# Patient Record
Sex: Male | Born: 1985 | Race: White | Hispanic: No | Marital: Single | Smoking: Current every day smoker
Health system: Southern US, Community
[De-identification: ages and names within clinical notes are randomized; demographics above are authoritative.]

## PROBLEM LIST (undated history)

## (undated) DIAGNOSIS — E669 Obesity, unspecified: Secondary | ICD-10-CM

## (undated) DIAGNOSIS — I10 Essential (primary) hypertension: Secondary | ICD-10-CM

## (undated) DIAGNOSIS — S6990XA Unspecified injury of unspecified wrist, hand and finger(s), initial encounter: Secondary | ICD-10-CM

## (undated) HISTORY — PX: FRACTURE SURGERY: SHX138

---
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
## 2005-08-10 ENCOUNTER — Emergency Department (HOSPITAL_COMMUNITY): Admission: EM | Admit: 2005-08-10 | Discharge: 2005-08-10 | Payer: Self-pay | Admitting: *Deleted

## 2006-05-08 DIAGNOSIS — E669 Obesity, unspecified: Secondary | ICD-10-CM

## 2006-05-08 DIAGNOSIS — J45909 Unspecified asthma, uncomplicated: Secondary | ICD-10-CM | POA: Insufficient documentation

## 2006-07-16 ENCOUNTER — Emergency Department (HOSPITAL_COMMUNITY): Admission: EM | Admit: 2006-07-16 | Discharge: 2006-07-17 | Payer: Self-pay | Admitting: Emergency Medicine

## 2007-06-12 ENCOUNTER — Emergency Department (HOSPITAL_COMMUNITY): Admission: EM | Admit: 2007-06-12 | Discharge: 2007-06-12 | Payer: Self-pay | Admitting: Emergency Medicine

## 2007-07-01 ENCOUNTER — Emergency Department (HOSPITAL_COMMUNITY): Admission: EM | Admit: 2007-07-01 | Discharge: 2007-07-01 | Payer: Self-pay | Admitting: Emergency Medicine

## 2008-02-18 ENCOUNTER — Emergency Department (HOSPITAL_COMMUNITY): Admission: EM | Admit: 2008-02-18 | Discharge: 2008-02-18 | Payer: Self-pay | Admitting: Emergency Medicine

## 2008-03-22 ENCOUNTER — Emergency Department (HOSPITAL_COMMUNITY): Admission: EM | Admit: 2008-03-22 | Discharge: 2008-03-22 | Payer: Self-pay | Admitting: Emergency Medicine

## 2008-05-03 ENCOUNTER — Emergency Department (HOSPITAL_COMMUNITY): Admission: EM | Admit: 2008-05-03 | Discharge: 2008-05-03 | Payer: Self-pay | Admitting: Family Medicine

## 2009-05-08 ENCOUNTER — Emergency Department (HOSPITAL_COMMUNITY): Admission: EM | Admit: 2009-05-08 | Discharge: 2009-05-08 | Payer: Self-pay | Admitting: Emergency Medicine

## 2009-05-10 ENCOUNTER — Emergency Department (HOSPITAL_COMMUNITY): Admission: EM | Admit: 2009-05-10 | Discharge: 2009-05-11 | Payer: Self-pay | Admitting: Emergency Medicine

## 2010-06-09 ENCOUNTER — Emergency Department (HOSPITAL_COMMUNITY)
Admission: EM | Admit: 2010-06-09 | Discharge: 2010-06-09 | Disposition: A | Discharge status: Home / Self Care | Payer: Self-pay | Attending: Emergency Medicine | Admitting: Emergency Medicine

## 2010-06-09 DIAGNOSIS — L989 Disorder of the skin and subcutaneous tissue, unspecified: Secondary | ICD-10-CM | POA: Insufficient documentation

## 2010-06-09 DIAGNOSIS — L255 Unspecified contact dermatitis due to plants, except food: Secondary | ICD-10-CM | POA: Insufficient documentation

## 2010-07-27 NOTE — Op Note (Signed)
[place]. [facility]  Patient:    Shaun Sanchez, Shaun Sanchez                       MRN: [id number] Proc. Date: 08/12/00 Adm. Date:  [id number] Attending:  Leonia Corona                           Operative Report  PREOPERATIVE DIAGNOSIS:  Acute appendicitis.  POSTOPERATIVE DIAGNOSIS:  Acute appendicitis.  PROCEDURE:  Open appendectomy.  ANESTHESIA:  General endotracheal tube anesthesia.  SURGEON:  Nelida Meuse, M.D.  ASSISTANT:  Nurse.  DESCRIPTION OF PROCEDURE:  The patient is brought into operating room, placed supine on operating table.  General endotracheal tube anesthesia is given. The right lower quadrant of the abdomen and the surrounding abdominal wall is cleaned, prepped, and draped in the usual manner.  A McBurney incision was made centered at the McBurney point, running slightly curvilinear along the skin crease in the right lower quadrant, measuring about 5-6 cm.  The skin incision is deepened through the subcutaneous tissue using electrocautery. The two layers of subcutaneous fatty fascial layers were divided with electrocautery until the aponeurosis of the external oblique is exposed, which was nicked with a knife and opened along the line of its fibers, running downward and medially and extending laterally, splitting the muscle fibers. After dividing the external aponeurosis, the fibers of the internal oblique and transversus abdominis muscles were split with the help of a blunt-tip hemostat, pierced with force, and fibers separated, immediately inserting the blade of [employer] retractor and stretching it to spread the fibers.  In view of his severe obesity, a very large layer of fat, subcutaneous fat, was encountered, and the limited exposure within here by spreading the muscles had a very limited access to the peritoneum, which was exposed.  The peritoneum was picked up with two hemostats and divided in between, and serous fluid  was immediately gushed out upon the incision, which was suctioned out completely. The peritoneal opening was enlarged with the help of scissors along the line of incision, and the blade of the retractor was inserted into the peritoneal cavity to visualize at this point that this is a very limited access to the peritoneal cavity.  We widened the muscle fibers medially and laterally for about 0.5 cm on each side to enhance the exposure.  I was able to palpate the tense, turgid, swollen appendix lying in the middle of the abdomen; however, the cecum was badly peritonealized and did not allow the delivery of the appendix out of the wound.  We had considerable difficulty in mobilizing the cecum and the appendix, so by blunt finger dissection and dividing the swollen mesentery containing the appendiceal vessels, which were divided between clamps and divided using 2-0 silk.  After gradually ligating the branches of the appendiceal artery with multiple stitches, we were able to clear up the appendix up to the base.  The wall of the cecum appeared healthy and normal. The appendix was held over the Babcock forceps and due to very tense and turgid wall on pulling out out of the wound, it tore and ruptured outside the peritoneal cavity; however, no spillage took place inside the peritoneal cavity.  The cecum was held over the left hand, and a pursestring suture was made using 4-0 silk around the base of the appendix.  The base of the appendix was crushed with a straight clamp, and  the clamp was applied above it.  The appendix was ligated with 3-0 Vicryl.  A double ligation was done, and the appendix was divided and delivered out of the wound.  The mucosa of the stump of the appendix was cauterized, and the stump was buried by tightening the silk pursestring suture.  The stump was neatly buried inside the pursestring suture.  The cecum was released and allowed to fall back into the peritoneal cavity.  A  very generous local washout with normal saline was done.  No oozing or bleeding was noted in the peritoneal cavity.  No attempt was made to run the bowel to look for any associated anomalies or Meckels.  Once again the peritoneal cavity was irrigated in a limited fashion, and complete suction was done from the pelvic area as well as the paracolic area.  The abdomen was now closed in multiple layers.  The peritoneum was closed with 2-0 Vicryl running stitch.  The divided muscles were repaired using interrupted stitches with 2-0 Vicryl.  The internal oblique and transversus abdominis muscles were approximated with two interrupted sutures using 2-0 Vicryl, and the external aponeurosis was repaired with 2-0 Vicryl interrupted sutures.  The wound was now irrigated with copious amount of saline, and approximately 20 cc of 0.25% Marcaine with epinephrine was infiltrated in and around the incision in the subcutaneous plane for postoperative pain control.  The skin was closed in two layers, the deeper subcutaneous layer using interrupted sutures with 2-0 Vicryl and the skin with 4-0 nylon interrupted sutures.  Steri-Strips were applied in _____ with the stitches, which was further covered with sterile gauze and OpSite dressing.  Patient tolerated the procedure very well, which was smooth and uneventful.  The patient was later extubated and transported to recovery room in good and stable condition. DD:  08/12/00 TD:  08/13/00 Job: [id number]

## 2010-07-27 NOTE — Op Note (Signed)
[place]. [facility]  Patient:    CREEK, GAN                         MRN: [id number] Proc. Date: 08/29/99 Attending:  Loraine Leriche C. Ophelia Charter, M.D.                           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  PROCEDURE:  Removal of single Ace cannulated screw, right hip.  SURGEON:  Mark C. Ophelia Charter, M.D.  ASSISTANT:  Quinn Plowman, P.A.-C.  ANESTHESIA:  General.  ESTIMATED BLOOD LOSS:  Less than 50 cc.  DESCRIPTION OF PROCEDURE:  After induction of general anesthesia, the patient was placed in the lateral position with an axillary roll underneath the left axilla and the right hip up.  The hip was prepped with Duraprep after a 10-15 drape was applied, split sheets, impervious stockinette, Coban was applied. Under fluoroscopy, appropriate position was outlined for a 3 cm incision.  The tensor fascia was split in line with its fibers.  Dissection with fingertip was used to feel the palpable pin.  A split was made in the vastus lateralis, and a Steinmann pin was inserted up the cannulated screw.  Cannulated screwdriver was then inserted over the Steinman pin and turned until the hexagonal screwdriver caught the head, and the pin was extracted.  After irrigation with saline solution, the patient had received some Ancef at the beginning of the procedure, and the tensor fascia was closed with 0 Vicryl interrupted sutures, 2-0 Vicryl in the subcutaneous tissue, 4-0 Vicryl subcuticular skin closure, tincture of benzoin and Steri-Strips and a small dressing.  The patient was transferred to the recovery room in stable condition.  Instrument count and needle count was correct. DD:  08/29/99 TD:  08/31/99 Job: [id number]

## 2010-07-27 NOTE — Discharge Summary (Signed)
[place]. [facility]  Patient:    Shaun Sanchez, Shaun Sanchez                       MRN: [id number] Adm. Date:  [id number] Disc. Date: [id number] Attending:  Leonia Corona Dictator:   Dalbert Mayotte, M.D.                           Discharge Summary  DATE OF BIRTH:  1985-10-14  DISCHARGE DIAGNOSIS:  Acute appendicitis.  DISCHARGE MEDICATIONS:  Tylenol p.r.n.  PROCEDURES:  Open appendectomy under general endotracheal anesthesia.  HISTORY OF PRESENT ILLNESS:  The patient is a 25 year old white male who presented with the onset of acute abdominal pain which began on Saturday in the periumbilical area and had continued to get worse since then, without any relief.  The pain was localized worse in the right lower quadrant, with some increasing intensity.  The patient did have associated nausea, vomiting, fever, and diarrhea.  ADMISSION LABORATORY DATA:  White count 14.9, 82% segs.  CT of the abdomen with rectal contrast reported to be consistent with acute appendicitis.  The patient was taken for open appendectomy.  HOSPITAL COURSE: #1 - After the findings of CT scan, the patient was taken for open appendectomy which was performed on the morning of August 12, 2000, by Dr. Leeanne Mannan.  The patient did have an intraoperative rupture outside of the peritoneal cavity of his appendix, but otherwise had an uncomplicated operation.  He did receive perioperative antibiotics for 24 hours and was kept n.p.o.  On the day after his surgery he was encouraged to ambulate about and was advanced to clears with sips of water and tolerated this well.  He was advanced to full clears.  On the morning of discharge, August 14, 2000, he was tolerating a liquid diet well.  He was ambulating well.  He had had a bowel movement and was passing flatus.  For this reason, he was advanced to a soft diet, tolerated this well, and was discharged home.  He remained afebrile over his hospitalization,  with the exception of the day prior to discharge with a t-max of 101.1.  #2 - BORDERLINE HIGH BLOOD PRESSURES:  The patient on admission had a blood pressure of 153/79.  This was on the Dinamap blood pressure cuff, and it was felt that the patient could have these elevated pressures secondary to pain, although he was rating his pain anywhere from 2-6 at that time.  This was followed, and it remained in the 140-150 range systolic/70-80 range diastolic but, on the day of discharge, a manual cuff was used, and the pressures were found to be in the 130s/60s and 70s.  This was felt to be most likely secondary to the Dinamap malfunction.  This is something that will need to be followed up on an outpatient basis to insure that there is no underlying hypertension, although he gave no family history of this.  DISPOSITION:  The patient was discharged home with his family.  DISCHARGE INSTRUCTIONS:  He was told to return if he has any concerns, or call Dr. Leeanne Mannan, or come back if he should note his incision becoming red, painful, or having any purulent discharge.  FOLLOW-UP:  He is scheduled to follow up with Dr. Leeanne Mannan on Friday, August 22, 2000, at 3 p.m. and with myself, Dr. Drue Second, on September 17, 2000, at 9:50 a.m. at the family practice center.  He was given the number, [phone], to call for directions or any other questions. DD:  08/14/00 TD:  08/16/00 Job: [id number]

## 2010-12-04 LAB — URINALYSIS, ROUTINE W REFLEX MICROSCOPIC
Bilirubin Urine: NEGATIVE
Glucose, UA: NEGATIVE
Hgb urine dipstick: NEGATIVE
Ketones, ur: NEGATIVE
Nitrite: NEGATIVE
Protein, ur: NEGATIVE
Specific Gravity, Urine: 1.022
Urobilinogen, UA: 1
pH: 8

## 2010-12-04 LAB — DIFFERENTIAL
Basophils Absolute: 0
Basophils Relative: 0
Eosinophils Absolute: 0.1
Eosinophils Relative: 1
Lymphocytes Relative: 22
Lymphs Abs: 1.8
Monocytes Absolute: 1
Monocytes Relative: 13 — ABNORMAL HIGH
Neutro Abs: 5.3
Neutrophils Relative %: 64

## 2010-12-04 LAB — CBC
HCT: 42.5
Hemoglobin: 15
MCHC: 35.2
MCV: 87.5
Platelets: 154
RBC: 4.86
RDW: 13.5
WBC: 8.2

## 2010-12-04 LAB — LIPASE, BLOOD: Lipase: 19

## 2010-12-04 LAB — HEPATIC FUNCTION PANEL
ALT: 20
AST: 28
Albumin: 3.6
Alkaline Phosphatase: 82
Bilirubin, Direct: 0.4 — ABNORMAL HIGH
Indirect Bilirubin: 0.7
Total Bilirubin: 1.1
Total Protein: 6.8

## 2010-12-04 LAB — POCT I-STAT, CHEM 8
BUN: 15
Calcium, Ion: 1.23
Chloride: 102
Creatinine, Ser: 1
Glucose, Bld: 92
HCT: 44
Hemoglobin: 15
Potassium: 4.2
Sodium: 140
TCO2: 27

## 2011-12-02 ENCOUNTER — Emergency Department: Payer: Self-pay | Admitting: Emergency Medicine

## 2012-02-24 ENCOUNTER — Emergency Department: Payer: Self-pay | Admitting: Emergency Medicine

## 2013-07-07 ENCOUNTER — Emergency Department: Payer: Self-pay | Admitting: Emergency Medicine

## 2013-07-07 LAB — URINALYSIS, COMPLETE
Bacteria: NONE SEEN
Bilirubin,UR: NEGATIVE
Blood: NEGATIVE
Glucose,UR: NEGATIVE mg/dL (ref 0–75)
Ketone: NEGATIVE
Leukocyte Esterase: NEGATIVE
Nitrite: NEGATIVE
Ph: 6 (ref 4.5–8.0)
Protein: NEGATIVE
RBC,UR: NONE SEEN /HPF (ref 0–5)
Specific Gravity: 1.058 (ref 1.003–1.030)
Squamous Epithelial: <1
WBC UR: 1 /HPF (ref 0–5)

## 2013-07-07 LAB — COMPREHENSIVE METABOLIC PANEL
Albumin: 4 g/dL (ref 3.4–5.0)
Alkaline Phosphatase: 99 U/L
Anion Gap: 8 (ref 7–16)
BUN: 18 mg/dL (ref 7–18)
Bilirubin,Total: 0.4 mg/dL (ref 0.2–1.0)
Calcium, Total: 8.8 mg/dL (ref 8.5–10.1)
Chloride: 104 mmol/L (ref 98–107)
Co2: 27 mmol/L (ref 21–32)
Creatinine: 0.98 mg/dL (ref 0.60–1.30)
EGFR (African American): >60
EGFR (Non-African Amer.): >60
Glucose: 117 mg/dL — ABNORMAL HIGH (ref 65–99)
Osmolality: 280 (ref 275–301)
Potassium: 3.9 mmol/L (ref 3.5–5.1)
SGOT(AST): 25 U/L (ref 15–37)
SGPT (ALT): 34 U/L (ref 12–78)
Sodium: 139 mmol/L (ref 136–145)
Total Protein: 7.7 g/dL (ref 6.4–8.2)

## 2013-07-07 LAB — CBC
HCT: 49 % (ref 40.0–52.0)
HGB: 16.2 g/dL (ref 13.0–18.0)
MCH: 29 pg (ref 26.0–34.0)
MCHC: 33.1 g/dL (ref 32.0–36.0)
MCV: 88 fL (ref 80–100)
Platelet: 156 10*3/uL (ref 150–440)
RBC: 5.59 10*6/uL (ref 4.40–5.90)
RDW: 14.1 % (ref 11.5–14.5)
WBC: 16.6 10*3/uL — ABNORMAL HIGH (ref 3.8–10.6)

## 2013-07-07 LAB — LIPASE, BLOOD: Lipase: 105 U/L (ref 73–393)

## 2015-11-07 ENCOUNTER — Emergency Department
Admission: EM | Admit: 2015-11-07 | Discharge: 2015-11-07 | Disposition: A | Discharge status: Home / Self Care | Payer: Managed Care, Other (non HMO) | Attending: Emergency Medicine | Admitting: Emergency Medicine

## 2015-11-07 ENCOUNTER — Encounter: Payer: Self-pay | Admitting: Emergency Medicine

## 2015-11-07 DIAGNOSIS — J45909 Unspecified asthma, uncomplicated: Secondary | ICD-10-CM | POA: Insufficient documentation

## 2015-11-07 DIAGNOSIS — R112 Nausea with vomiting, unspecified: Secondary | ICD-10-CM | POA: Insufficient documentation

## 2015-11-07 DIAGNOSIS — R197 Diarrhea, unspecified: Secondary | ICD-10-CM | POA: Insufficient documentation

## 2015-11-07 DIAGNOSIS — F17210 Nicotine dependence, cigarettes, uncomplicated: Secondary | ICD-10-CM | POA: Insufficient documentation

## 2015-11-07 DIAGNOSIS — R110 Nausea: Secondary | ICD-10-CM

## 2015-11-07 LAB — BASIC METABOLIC PANEL
Anion gap: 5 (ref 5–15)
BUN: 19 mg/dL (ref 6–20)
CO2: 27 mmol/L (ref 22–32)
Calcium: 9.3 mg/dL (ref 8.9–10.3)
Chloride: 106 mmol/L (ref 101–111)
Creatinine, Ser: 0.86 mg/dL (ref 0.61–1.24)
GFR calc Af Amer: >60 mL/min (ref >60)
GFR calc non Af Amer: >60 mL/min (ref >60)
Glucose, Bld: 98 mg/dL (ref 65–99)
Potassium: 4.6 mmol/L (ref 3.5–5.1)
Sodium: 138 mmol/L (ref 135–145)

## 2015-11-07 LAB — CBC WITH DIFFERENTIAL/PLATELET
Basophils Absolute: 0.1 10*3/uL (ref 0–0.1)
Basophils Relative: 1 %
Eosinophils Absolute: 0.1 10*3/uL (ref 0–0.7)
Eosinophils Relative: 1 %
HCT: 45.6 % (ref 40.0–52.0)
Hemoglobin: 16.1 g/dL (ref 13.0–18.0)
Lymphocytes Relative: 27 %
Lymphs Abs: 2.7 10*3/uL (ref 1.0–3.6)
MCH: 31.2 pg (ref 26.0–34.0)
MCHC: 35.4 g/dL (ref 32.0–36.0)
MCV: 88 fL (ref 80.0–100.0)
Monocytes Absolute: 0.8 10*3/uL (ref 0.2–1.0)
Monocytes Relative: 8 %
Neutro Abs: 6.2 10*3/uL (ref 1.4–6.5)
Neutrophils Relative %: 63 %
Platelets: 157 10*3/uL (ref 150–440)
RBC: 5.18 MIL/uL (ref 4.40–5.90)
RDW: 13.4 % (ref 11.5–14.5)
WBC: 9.8 10*3/uL (ref 3.8–10.6)

## 2015-11-07 MED ORDER — ONDANSETRON 4 MG PO TBDP
4.0000 mg | ORAL_TABLET | Freq: Once | ORAL | Status: AC
  Administered 2015-11-07: 4 mg via ORAL
  Filled 2015-11-07: qty 1

## 2015-11-07 MED ORDER — ONDANSETRON HCL 4 MG PO TABS: 4.0000 mg | ORAL_TABLET | Freq: Three times a day (TID) | ORAL | 0 refills | Status: AC | PRN

## 2015-11-07 NOTE — ED Provider Notes (Signed)
[facility] Emergency Department Provider Note  ____________________________________________  Time seen: Approximately 2:10 PM  I have reviewed the triage vital signs and the nursing notes.   HISTORY  Chief Complaint Emesis    HPI Shaun Sanchez is a 30 y.o. male , NAD, presents to the emergency department with 2 day history of nausea and vomiting. Patient states he had sudden onset of upset stomach and nausea began yesterday. Episodes are sporadic and not associated with eating or drinking. Has not had any abdominal pain or cramping. Had one episode of diarrhea that has since resolved. Has had normal bowel movements over the last 24 hours. Denies any blood in the emesis or stool. No fevers, chills, body aches, sore throat, rash. No fatigue, chest pain, shortness of breath. No diaphoresis. No known sick contacts.   History reviewed. No pertinent past medical history.  Patient Active Problem List   Diagnosis Date Noted  . OBESITY, NOS 05/08/2006  . ASTHMA, UNSPECIFIED 05/08/2006    History reviewed. No pertinent surgical history.  Prior to Admission medications   Medication Sig Start Date End Date Taking? Authorizing Provider  ondansetron (ZOFRAN) 4 MG tablet Take 1 tablet (4 mg total) by mouth every 8 (eight) hours as needed for nausea or vomiting. 11/07/15 11/14/15  Rahshawn Remo L Xerxes Agrusa, PA-C    Allergies Review of patient's allergies indicates not on file.  History reviewed. No pertinent family history.  Social History Social History  Substance Use Topics  . Smoking status: Current Every Day Smoker    Packs/day: 1.00    Types: Cigarettes  . Smokeless tobacco: Never Used  . Alcohol use No     Review of Systems  Constitutional: No fever/chills, fatigue ENT: No sore throat. Cardiovascular: No chest pain. Respiratory:  No shortness of breath. No wheezing.  Gastrointestinal: Positive nausea, vomiting. One episode diarrhea that has resolved. No  abdominal pain. No constipation. Genitourinary: Negative for dysuria, hematuria. No urinary hesitancy, urgency or increased frequency. Musculoskeletal: Negative for back pain nor general myalgias.  Skin: Negative for rash, skin sores. Neurological: Negative for headaches, focal weakness or numbness. No saddle paresthesias, loss of bowel or bladder control 10-point ROS otherwise negative.  ____________________________________________   PHYSICAL EXAM:  VITAL SIGNS: ED Triage Vitals [11/07/15 1345]  Enc Vitals Group     BP (!) 139/96     Pulse Rate 87     Resp 16     Temp 97.9 F (36.6 C)     Temp Source Oral     SpO2 96 %     Weight 280 lb (127 kg)     Height 6\' 2"  (1.88 m)     Head Circumference      Peak Flow      Pain Score      Pain Loc      Pain Edu?      Excl. in GC?      Constitutional: Alert and oriented. Well appearing and in no acute distress. Eyes: Conjunctivae are normal.  Head: Atraumatic. Cardiovascular: Normal rate, regular rhythm. Normal S1 and S2.  Good peripheral circulation. Respiratory: Normal respiratory effort without tachypnea or retractions. Lungs CTAB with breath sounds noted in all lung fields. Gastrointestinal: Soft and nontender without distention or guarding in all quadrants. Bowel sounds grossly normoactive in all quadrants. No rebound or rigidity. Musculoskeletal: No lower extremity tenderness nor edema.  No joint effusions. Neurologic:  Normal speech and language. No gross focal neurologic deficits are appreciated.  Skin:  Skin is warm, dry and intact. No rash noted. Psychiatric: Mood and affect are normal. Speech and behavior are normal. Patient exhibits appropriate insight and judgement.   ____________________________________________   LABS (all labs ordered are listed, but only abnormal results are displayed)  Labs Reviewed  BASIC METABOLIC PANEL  CBC WITH DIFFERENTIAL/PLATELET    ____________________________________________  EKG  None ____________________________________________  RADIOLOGY  None ____________________________________________    PROCEDURES  Procedure(s) performed: None   Procedures   Medications  ondansetron (ZOFRAN-ODT) disintegrating tablet 4 mg (4 mg Oral Given 11/07/15 1431)     ____________________________________________   INITIAL IMPRESSION / ASSESSMENT AND PLAN / ED COURSE  Pertinent labs & imaging results that were available during my care of the patient were reviewed by me and considered in my medical decision making (see chart for details).  Clinical Course    Patient's diagnosis is consistent with nausea. Anticipate symptoms are related to a viral infection. Patient will be discharged home with prescriptions for zofran to take as needed. [facility] if symptoms persist past this treatment course. Patient is given ED precautions to return to the ED for any worsening or new symptoms.    ____________________________________________  FINAL CLINICAL IMPRESSION(S) / ED DIAGNOSES  Final diagnoses:  Nausea      NEW MEDICATIONS STARTED DURING THIS VISIT:  Discharge Medication List as of 11/07/2015  3:33 PM    START taking these medications   Details  ondansetron (ZOFRAN) 4 MG tablet Take 1 tablet (4 mg total) by mouth every 8 (eight) hours as needed for nausea or vomiting., Starting Tue 11/07/2015, Until Tue 11/14/2015, Print             Ernestene KielJami L GirdletreeHagler, PA-C 11/07/15 1620    Gayla DossEryka A Gayle, MD 11/16/15 1519

## 2015-11-07 NOTE — ED Triage Notes (Signed)
Pt presents with nausea and vomiting since yesterday morning. Pt denies fever, chills. States he has vomited x 7 in last 24 hours; 1 episode of loose stool yesterday. Pt alert & oriented with nad noted.

## 2017-07-28 ENCOUNTER — Emergency Department
Admission: EM | Admit: 2017-07-28 | Discharge: 2017-07-28 | Disposition: A | Discharge status: Home / Self Care | Payer: Managed Care, Other (non HMO) | Attending: Emergency Medicine | Admitting: Emergency Medicine

## 2017-07-28 ENCOUNTER — Encounter: Payer: Self-pay | Admitting: Medical Oncology

## 2017-07-28 DIAGNOSIS — F17210 Nicotine dependence, cigarettes, uncomplicated: Secondary | ICD-10-CM | POA: Insufficient documentation

## 2017-07-28 DIAGNOSIS — J45909 Unspecified asthma, uncomplicated: Secondary | ICD-10-CM | POA: Insufficient documentation

## 2017-07-28 DIAGNOSIS — L237 Allergic contact dermatitis due to plants, except food: Secondary | ICD-10-CM | POA: Insufficient documentation

## 2017-07-28 MED ORDER — TRIAMCINOLONE ACETONIDE 40 MG/ML IJ SUSP
40.0000 mg | Freq: Once | INTRAMUSCULAR | Status: AC
  Administered 2017-07-28: 40 mg via INTRAMUSCULAR
  Filled 2017-07-28: qty 1

## 2017-07-28 NOTE — ED Notes (Signed)
See triage note  States he thinks he has gotten into poison oak/ivy   Rash noted to both arms

## 2017-07-28 NOTE — ED Triage Notes (Signed)
Pt reports bilt upper ext rash that is itchy, also reports itchy rt eye. Pt reports he thinks its poison oak.

## 2017-07-28 NOTE — ED Provider Notes (Signed)
[facility] Emergency Department Provider Note  ____________________________________________  Time seen: Approximately 4:11 PM  I have reviewed the triage vital signs and the nursing notes.   HISTORY  Chief Complaint Rash    HPI Shaun Sanchez is a 32 y.o. male presents to the emergency department with poison oak dermatitis along the bilateral upper extremities.  Patient reports that rash has progressed to involve his face and genitals.  He denies dysphagia, shortness of breath, cough, nausea, vomiting or history of anaphylaxis.  Patient has tried calamine lotion.   History reviewed. No pertinent past medical history.  Patient Active Problem List   Diagnosis Date Noted  . OBESITY, NOS 05/08/2006  . ASTHMA, UNSPECIFIED 05/08/2006    History reviewed. No pertinent surgical history.  Prior to Admission medications   Not on File    Allergies Patient has no known allergies.  No family history on file.  Social History Social History   Tobacco Use  . Smoking status: Current Every Day Smoker    Packs/day: 1.00    Types: Cigarettes  . Smokeless tobacco: Never Used  Substance Use Topics  . Alcohol use: No  . Drug use: No     Review of Systems  Constitutional: No fever/chills Eyes: No visual changes. No discharge ENT: No upper respiratory complaints. Cardiovascular: no chest pain. Respiratory: no cough. No SOB. Gastrointestinal: No abdominal pain.  No nausea, no vomiting.  No diarrhea.  No constipation. Musculoskeletal: Negative for musculoskeletal pain. Skin: Patient has poison oak dermatitis. Neurological: Negative for headaches, focal weakness or numbness.   ____________________________________________   PHYSICAL EXAM:  VITAL SIGNS: ED Triage Vitals  Enc Vitals Group     BP 07/28/17 1536 (!) 133/100     Pulse Rate 07/28/17 1536 89     Resp 07/28/17 1536 20     Temp 07/28/17 1536 98.6 F (37 C)     Temp Source 07/28/17 1536  Oral     SpO2 07/28/17 1536 94 %     Weight 07/28/17 1530 280 lb (127 kg)     Height 07/28/17 1530  (1.88 m)     Head Circumference --      Peak Flow --      Pain Score 07/28/17 1530 0     Pain Loc --      Pain Edu? --      Excl. in GC? --      Constitutional: Alert and oriented. Well appearing and in no acute distress. Eyes: Conjunctivae are normal. PERRL. EOMI. Head: Atraumatic. ENT:      Ears: TMs are pearly.      Nose: No congestion/rhinnorhea.      Mouth/Throat: Mucous membranes are moist.  Neck: No stridor.  No cervical spine tenderness to palpation. Cardiovascular: Normal rate, regular rhythm. Normal S1 and S2.  Good peripheral circulation. Respiratory: Normal respiratory effort without tachypnea or retractions. Lungs CTAB. Good air entry to the bases with no decreased or absent breath sounds. Musculoskeletal: Full range of motion to all extremities. No gross deformities appreciated. Neurologic:  Normal speech and language. No gross focal neurologic deficits are appreciated.  Skin: Patient has diffuse, linear rash with  along the upper extremities, face and groin. Psychiatric: Mood and affect are normal. Speech and behavior are normal. Patient exhibits appropriate insight and judgement.   ____________________________________________   LABS (all labs ordered are listed, but only abnormal results are displayed)  Labs Reviewed - No data to display ____________________________________________  EKG  ____________________________________________  RADIOLOGY   No results found.  ____________________________________________    PROCEDURES  Procedure(s) performed:    Procedures    Medications  triamcinolone acetonide (KENALOG-40) injection 40 mg (has no administration in time range)     ____________________________________________   INITIAL IMPRESSION / ASSESSMENT AND PLAN / ED COURSE  Pertinent labs & imaging results that were available during  my care of the patient were reviewed by me and considered in my medical decision making (see chart for details).  Review of the [place] CSRS was performed in accordance of the [facility] prior to dispensing any controlled drugs.     Poison oak dermatitis Patient presents to the emergency department with poison oak dermatitis.  He received an injection of Kenalog in the emergency department.  Patient was advised to follow-up with primary care as needed.  All patient questions were answered.    ____________________________________________  FINAL CLINICAL IMPRESSION(S) / ED DIAGNOSES  Final diagnoses:  Poison oak      NEW MEDICATIONS STARTED DURING THIS VISIT:  ED Discharge Orders    None          This chart was dictated using voice recognition software/Dragon. Despite best efforts to proofread, errors can occur which can change the meaning. Any change was purely unintentional.    Orvil Feil, PA-C 07/28/17 1614    Minna Antis, MD 07/28/17 [phone]

## 2018-01-12 ENCOUNTER — Emergency Department (HOSPITAL_COMMUNITY): Payer: No Typology Code available for payment source

## 2018-01-12 ENCOUNTER — Emergency Department (HOSPITAL_COMMUNITY)
Admission: EM | Admit: 2018-01-12 | Discharge: 2018-01-12 | Disposition: A | Discharge status: Home / Self Care | Payer: No Typology Code available for payment source | Attending: Emergency Medicine | Admitting: Emergency Medicine

## 2018-01-12 ENCOUNTER — Other Ambulatory Visit: Payer: Self-pay

## 2018-01-12 ENCOUNTER — Encounter (HOSPITAL_COMMUNITY): Payer: Self-pay

## 2018-01-12 DIAGNOSIS — F17210 Nicotine dependence, cigarettes, uncomplicated: Secondary | ICD-10-CM | POA: Insufficient documentation

## 2018-01-12 DIAGNOSIS — Y999 Unspecified external cause status: Secondary | ICD-10-CM | POA: Insufficient documentation

## 2018-01-12 DIAGNOSIS — Y929 Unspecified place or not applicable: Secondary | ICD-10-CM | POA: Insufficient documentation

## 2018-01-12 DIAGNOSIS — R079 Chest pain, unspecified: Secondary | ICD-10-CM | POA: Diagnosis present

## 2018-01-12 DIAGNOSIS — J45909 Unspecified asthma, uncomplicated: Secondary | ICD-10-CM | POA: Insufficient documentation

## 2018-01-12 DIAGNOSIS — Y9389 Activity, other specified: Secondary | ICD-10-CM | POA: Insufficient documentation

## 2018-01-12 DIAGNOSIS — R1013 Epigastric pain: Secondary | ICD-10-CM | POA: Diagnosis not present

## 2018-01-12 DIAGNOSIS — Z79899 Other long term (current) drug therapy: Secondary | ICD-10-CM | POA: Diagnosis not present

## 2018-01-12 DIAGNOSIS — S01511A Laceration without foreign body of lip, initial encounter: Secondary | ICD-10-CM | POA: Insufficient documentation

## 2018-01-12 DIAGNOSIS — R51 Headache: Secondary | ICD-10-CM | POA: Diagnosis not present

## 2018-01-12 LAB — CBC
HCT: 43.2 % (ref 39.0–52.0)
Hemoglobin: 14.2 g/dL (ref 13.0–17.0)
MCH: 30 pg (ref 26.0–34.0)
MCHC: 32.9 g/dL (ref 30.0–36.0)
MCV: 91.3 fL (ref 80.0–100.0)
NRBC: 0 % (ref 0.0–0.2)
Platelets: 166 10*3/uL (ref 150–400)
RBC: 4.73 MIL/uL (ref 4.22–5.81)
RDW: 12.6 % (ref 11.5–15.5)
WBC: 18.6 10*3/uL — AB (ref 4.0–10.5)

## 2018-01-12 LAB — PROTIME-INR
INR: 1.16
PROTHROMBIN TIME: 14.6 s (ref 11.4–15.2)

## 2018-01-12 LAB — COMPREHENSIVE METABOLIC PANEL
ALK PHOS: 61 U/L (ref 38–126)
ALT: 35 U/L (ref 0–44)
AST: 32 U/L (ref 15–41)
Albumin: 3.9 g/dL (ref 3.5–5.0)
Anion gap: 6 (ref 5–15)
BILIRUBIN TOTAL: 0.5 mg/dL (ref 0.3–1.2)
BUN: 18 mg/dL (ref 6–20)
CO2: 26 mmol/L (ref 22–32)
Calcium: 9.1 mg/dL (ref 8.9–10.3)
Chloride: 106 mmol/L (ref 98–111)
Creatinine, Ser: 0.94 mg/dL (ref 0.61–1.24)
GFR calc Af Amer: >60 mL/min (ref >60)
Glucose, Bld: 126 mg/dL — ABNORMAL HIGH (ref 70–99)
Potassium: 4 mmol/L (ref 3.5–5.1)
Sodium: 138 mmol/L (ref 135–145)
TOTAL PROTEIN: 6.4 g/dL — AB (ref 6.5–8.1)

## 2018-01-12 LAB — SAMPLE TO BLOOD BANK

## 2018-01-12 LAB — ETHANOL

## 2018-01-12 LAB — CDS SEROLOGY

## 2018-01-12 MED ORDER — IOHEXOL 300 MG/ML  SOLN
100.0000 mL | Freq: Once | INTRAMUSCULAR | Status: AC | PRN
  Administered 2018-01-12: 100 mL via INTRAVENOUS

## 2018-01-12 MED ORDER — OXYCODONE HCL 5 MG PO TABS
5.0000 mg | ORAL_TABLET | Freq: Once | ORAL | Status: AC
  Administered 2018-01-12: 5 mg via ORAL
  Filled 2018-01-12: qty 1

## 2018-01-12 MED ORDER — ONDANSETRON HCL 4 MG/2ML IJ SOLN
4.0000 mg | Freq: Once | INTRAMUSCULAR | Status: AC
  Administered 2018-01-12: 4 mg via INTRAVENOUS
  Filled 2018-01-12: qty 2

## 2018-01-12 MED ORDER — ACETAMINOPHEN 500 MG PO TABS
1000.0000 mg | ORAL_TABLET | Freq: Once | ORAL | Status: AC
  Administered 2018-01-12: 1000 mg via ORAL
  Filled 2018-01-12: qty 2

## 2018-01-12 MED ORDER — MORPHINE SULFATE (PF) 4 MG/ML IV SOLN
4.0000 mg | Freq: Once | INTRAVENOUS | Status: AC
  Administered 2018-01-12: 4 mg via INTRAVENOUS
  Filled 2018-01-12: qty 1

## 2018-01-12 MED ORDER — CEFAZOLIN SODIUM-DEXTROSE 1-4 GM/50ML-% IV SOLN
1.0000 g | Freq: Once | INTRAVENOUS | Status: AC
  Administered 2018-01-12: 1 g via INTRAVENOUS
  Filled 2018-01-12: qty 50

## 2018-01-12 MED ORDER — TETANUS-DIPHTH-ACELL PERTUSSIS 5-2.5-18.5 LF-MCG/0.5 IM SUSP
0.5000 mL | Freq: Once | INTRAMUSCULAR | Status: AC
  Administered 2018-01-12: 0.5 mL via INTRAMUSCULAR
  Filled 2018-01-12: qty 0.5

## 2018-01-12 MED ORDER — LIDOCAINE-EPINEPHRINE (PF) 2 %-1:200000 IJ SOLN
20.0000 mL | Freq: Once | INTRAMUSCULAR | Status: AC
  Administered 2018-01-12: 20 mL
  Filled 2018-01-12: qty 20

## 2018-01-12 MED ORDER — AMOXICILLIN-POT CLAVULANATE 875-125 MG PO TABS: 1.0000 | ORAL_TABLET | Freq: Two times a day (BID) | ORAL | 0 refills | Status: AC

## 2018-01-12 NOTE — ED Notes (Signed)
Amputated portion of R little finger wrapped in saline-soaked sterile gauze and placed on ice - at bedside.
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
## 2018-01-12 NOTE — Progress Notes (Signed)
Pt denies SOB, chest pain, and being under the care of a cardiologist. Pt denies having a stress test, echo and cardiac cath. Pt made aware to stop taking  Aspirin,vitamins, fish oil and herbal medications. Do not take any NSAIDs ie: Ibuprofen, Advil, Naproxen (Aleve), Motrin, BC and Goody Powder. Pt verbalized understanding of all pre-op instructions. 

## 2018-01-12 NOTE — ED Notes (Signed)
Dr. Adela Lank at bedsid efor suturing. Family at bedside
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
## 2018-01-12 NOTE — ED Notes (Signed)
Pt cleaned of blood and wet tot dry dressig  Placed at right 4th finger. Face cleaned and dressed with bacitracin.

## 2018-01-12 NOTE — ED Notes (Signed)
Patient transported to CT 
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
## 2018-01-13 ENCOUNTER — Encounter (HOSPITAL_COMMUNITY)
Admission: RE | Disposition: A | Discharge status: Home / Self Care | Payer: Self-pay | Source: Ambulatory Visit | Attending: General Surgery

## 2018-01-13 ENCOUNTER — Encounter (HOSPITAL_COMMUNITY): Payer: Self-pay | Admitting: Anesthesiology

## 2018-01-13 ENCOUNTER — Ambulatory Visit (HOSPITAL_COMMUNITY): Payer: No Typology Code available for payment source | Admitting: Anesthesiology

## 2018-01-13 ENCOUNTER — Ambulatory Visit (HOSPITAL_COMMUNITY)
Admission: RE | Admit: 2018-01-13 | Discharge: 2018-01-13 | Disposition: A | Discharge status: Home / Self Care | Payer: No Typology Code available for payment source | Source: Ambulatory Visit | Attending: General Surgery | Admitting: General Surgery

## 2018-01-13 DIAGNOSIS — F17220 Nicotine dependence, chewing tobacco, uncomplicated: Secondary | ICD-10-CM | POA: Diagnosis not present

## 2018-01-13 DIAGNOSIS — F17290 Nicotine dependence, other tobacco product, uncomplicated: Secondary | ICD-10-CM | POA: Diagnosis not present

## 2018-01-13 DIAGNOSIS — I10 Essential (primary) hypertension: Secondary | ICD-10-CM | POA: Insufficient documentation

## 2018-01-13 DIAGNOSIS — F17210 Nicotine dependence, cigarettes, uncomplicated: Secondary | ICD-10-CM | POA: Insufficient documentation

## 2018-01-13 DIAGNOSIS — S68616A Complete traumatic transphalangeal amputation of right little finger, initial encounter: Secondary | ICD-10-CM | POA: Insufficient documentation

## 2018-01-13 HISTORY — DX: Obesity, unspecified: E66.9

## 2018-01-13 HISTORY — DX: Unspecified injury of unspecified wrist, hand and finger(s), initial encounter: S69.90XA

## 2018-01-13 HISTORY — PX: SKIN FULL THICKNESS GRAFT: SHX442

## 2018-01-13 HISTORY — DX: Essential (primary) hypertension: I10

## 2018-01-13 SURGERY — APPLICATION, GRAFT, SKIN, FULL-THICKNESS
Anesthesia: General | Site: Finger | Laterality: Right

## 2018-01-13 MED ORDER — LIDOCAINE-EPINEPHRINE 1 %-1:100000 IJ SOLN
INTRAMUSCULAR | Status: DC | PRN
  Administered 2018-01-13: 10 mL

## 2018-01-13 MED ORDER — MIDAZOLAM HCL 2 MG/2ML IJ SOLN
INTRAMUSCULAR | Status: DC | PRN
  Administered 2018-01-13: 2 mg via INTRAVENOUS

## 2018-01-13 MED ORDER — ONDANSETRON HCL 4 MG/2ML IJ SOLN
INTRAMUSCULAR | Status: AC
  Filled 2018-01-13: qty 2

## 2018-01-13 MED ORDER — CEFAZOLIN SODIUM 1 G IJ SOLR
INTRAMUSCULAR | Status: AC
  Filled 2018-01-13: qty 30

## 2018-01-13 MED ORDER — FENTANYL CITRATE (PF) 100 MCG/2ML IJ SOLN
50.0000 ug | Freq: Once | INTRAMUSCULAR | Status: AC
  Administered 2018-01-13: 50 ug via INTRAVENOUS

## 2018-01-13 MED ORDER — PROPOFOL 10 MG/ML IV BOLUS
INTRAVENOUS | Status: AC
  Filled 2018-01-13: qty 40

## 2018-01-13 MED ORDER — ONDANSETRON HCL 4 MG/2ML IJ SOLN
INTRAMUSCULAR | Status: DC | PRN
  Administered 2018-01-13: 4 mg via INTRAVENOUS

## 2018-01-13 MED ORDER — LIDOCAINE 2% (20 MG/ML) 5 ML SYRINGE
INTRAMUSCULAR | Status: AC
  Filled 2018-01-13: qty 5

## 2018-01-13 MED ORDER — FENTANYL CITRATE (PF) 100 MCG/2ML IJ SOLN
INTRAMUSCULAR | Status: AC
  Administered 2018-01-13: 50 ug via INTRAVENOUS
  Filled 2018-01-13: qty 2

## 2018-01-13 MED ORDER — PROPOFOL 10 MG/ML IV BOLUS
INTRAVENOUS | Status: DC | PRN
  Administered 2018-01-13: 100 mg via INTRAVENOUS
  Administered 2018-01-13: 250 mg via INTRAVENOUS
  Administered 2018-01-13: 50 mg via INTRAVENOUS

## 2018-01-13 MED ORDER — BUPIVACAINE HCL (PF) 0.25 % IJ SOLN
INTRAMUSCULAR | Status: DC | PRN
  Administered 2018-01-13: 7 mL

## 2018-01-13 MED ORDER — 0.9 % SODIUM CHLORIDE (POUR BTL) OPTIME
TOPICAL | Status: DC | PRN
  Administered 2018-01-13: 1000 mL

## 2018-01-13 MED ORDER — FENTANYL CITRATE (PF) 250 MCG/5ML IJ SOLN
INTRAMUSCULAR | Status: DC | PRN
  Administered 2018-01-13: 100 ug via INTRAVENOUS
  Administered 2018-01-13: 50 ug via INTRAVENOUS
  Administered 2018-01-13: 100 ug via INTRAVENOUS

## 2018-01-13 MED ORDER — FENTANYL CITRATE (PF) 250 MCG/5ML IJ SOLN
INTRAMUSCULAR | Status: AC
  Filled 2018-01-13: qty 5

## 2018-01-13 MED ORDER — DEXAMETHASONE SODIUM PHOSPHATE 10 MG/ML IJ SOLN
INTRAMUSCULAR | Status: AC
  Filled 2018-01-13: qty 1

## 2018-01-13 MED ORDER — DEXAMETHASONE SODIUM PHOSPHATE 10 MG/ML IJ SOLN
INTRAMUSCULAR | Status: DC | PRN
  Administered 2018-01-13: 10 mg via INTRAVENOUS

## 2018-01-13 MED ORDER — DEXMEDETOMIDINE HCL IN NACL 200 MCG/50ML IV SOLN
INTRAVENOUS | Status: DC | PRN
  Administered 2018-01-13: 8 ug via INTRAVENOUS

## 2018-01-13 MED ORDER — CEFAZOLIN SODIUM-DEXTROSE 1-4 GM/50ML-% IV SOLN
INTRAVENOUS | Status: DC | PRN
  Administered 2018-01-13: 3 g via INTRAVENOUS

## 2018-01-13 MED ORDER — BUPIVACAINE HCL (PF) 0.25 % IJ SOLN
INTRAMUSCULAR | Status: AC
  Filled 2018-01-13: qty 30

## 2018-01-13 MED ORDER — MIDAZOLAM HCL 2 MG/2ML IJ SOLN
INTRAMUSCULAR | Status: AC
  Filled 2018-01-13: qty 2

## 2018-01-13 MED ORDER — LIDOCAINE-EPINEPHRINE 1 %-1:100000 IJ SOLN
INTRAMUSCULAR | Status: AC
  Filled 2018-01-13: qty 1

## 2018-01-13 MED ORDER — CEFAZOLIN (ANCEF) 1 G IV SOLR: 2.0000 g | INTRAVENOUS | Status: DC

## 2018-01-13 MED ORDER — LACTATED RINGERS IV SOLN
INTRAVENOUS | Status: DC
  Administered 2018-01-13: 10:00:00 via INTRAVENOUS

## 2018-01-13 MED ORDER — ARTIFICIAL TEARS OPHTHALMIC OINT
TOPICAL_OINTMENT | OPHTHALMIC | Status: AC
  Filled 2018-01-13: qty 7

## 2018-01-13 MED ORDER — DEXMEDETOMIDINE HCL IN NACL 200 MCG/50ML IV SOLN
INTRAVENOUS | Status: AC
  Filled 2018-01-13: qty 50

## 2018-01-13 MED ORDER — LIDOCAINE 2% (20 MG/ML) 5 ML SYRINGE
INTRAMUSCULAR | Status: DC | PRN
  Administered 2018-01-13: 60 mg via INTRAVENOUS

## 2018-01-13 SURGICAL SUPPLY — 57 items
BANDAGE ACE 6X5 VEL STRL LF (GAUZE/BANDAGES/DRESSINGS) IMPLANT
BANDAGE ELASTIC 4 VELCRO ST LF (GAUZE/BANDAGES/DRESSINGS) ×2 IMPLANT
BLADE CLIPPER SURG (BLADE) IMPLANT
BNDG CMPR 9X4 STRL LF SNTH (GAUZE/BANDAGES/DRESSINGS) ×1
BNDG COHESIVE 1X5 TAN STRL LF (GAUZE/BANDAGES/DRESSINGS) ×2 IMPLANT
BNDG ESMARK 4X9 LF (GAUZE/BANDAGES/DRESSINGS) ×3 IMPLANT
BNDG GAUZE ELAST 4 BULKY (GAUZE/BANDAGES/DRESSINGS) ×2 IMPLANT
CANISTER SUCT 3000ML PPV (MISCELLANEOUS) IMPLANT
CORDS BIPOLAR (ELECTRODE) ×3 IMPLANT
COVER SURGICAL LIGHT HANDLE (MISCELLANEOUS) ×3 IMPLANT
COVER WAND RF STERILE (DRAPES) ×1 IMPLANT
CUFF TOURNIQUET SINGLE 18IN (TOURNIQUET CUFF) IMPLANT
DERMACARRIERS GRAFT 1 TO 1.5 (DISPOSABLE)
DRAPE SURG 17X23 STRL (DRAPES) ×1 IMPLANT
DRSG ADAPTIC 3X8 NADH LF (GAUZE/BANDAGES/DRESSINGS) IMPLANT
DRSG MEPITEL 4X7.2 (GAUZE/BANDAGES/DRESSINGS) ×3 IMPLANT
DRSG PAD ABDOMINAL 8X10 ST (GAUZE/BANDAGES/DRESSINGS) IMPLANT
ELECT REM PT RETURN 9FT ADLT (ELECTROSURGICAL)
ELECTRODE REM PT RTRN 9FT ADLT (ELECTROSURGICAL) IMPLANT
GAUZE SPONGE 4X4 12PLY STRL (GAUZE/BANDAGES/DRESSINGS) IMPLANT
GAUZE SPONGE 4X4 12PLY STRL LF (GAUZE/BANDAGES/DRESSINGS) ×2 IMPLANT
GAUZE XEROFORM 1X8 LF (GAUZE/BANDAGES/DRESSINGS) ×2 IMPLANT
GAUZE XEROFORM 5X9 LF (GAUZE/BANDAGES/DRESSINGS) IMPLANT
GLOVE BIO SURGEON STRL SZ8 (GLOVE) ×3 IMPLANT
GLOVE BIOGEL PI IND STRL 8.5 (GLOVE) ×1 IMPLANT
GLOVE BIOGEL PI INDICATOR 8.5 (GLOVE)
GLOVE ORTHO TXT STRL SZ7.5 (GLOVE) ×1 IMPLANT
GLOVE SURG ORTHO 8.0 STRL STRW (GLOVE) ×1 IMPLANT
GOWN STRL REUS W/ TWL LRG LVL3 (GOWN DISPOSABLE) ×1 IMPLANT
GOWN STRL REUS W/ TWL XL LVL3 (GOWN DISPOSABLE) ×1 IMPLANT
GOWN STRL REUS W/TWL LRG LVL3 (GOWN DISPOSABLE)
GOWN STRL REUS W/TWL XL LVL3 (GOWN DISPOSABLE) ×3
GRAFT DERMACARRIERS 1 TO 1.5 (DISPOSABLE) IMPLANT
HANDPIECE INTERPULSE COAX TIP (DISPOSABLE)
KIT BASIN OR (CUSTOM PROCEDURE TRAY) ×3 IMPLANT
KIT TURNOVER KIT B (KITS) ×3 IMPLANT
MANIFOLD NEPTUNE II (INSTRUMENTS) IMPLANT
MARKER SKIN DUAL TIP RULER LAB (MISCELLANEOUS) ×3 IMPLANT
NS IRRIG 1000ML POUR BTL (IV SOLUTION) ×3 IMPLANT
PACK ORTHO EXTREMITY (CUSTOM PROCEDURE TRAY) ×3 IMPLANT
PAD ARMBOARD 7.5X6 YLW CONV (MISCELLANEOUS) ×6 IMPLANT
PAD CAST 3X4 CTTN HI CHSV (CAST SUPPLIES) IMPLANT
PADDING CAST COTTON 3X4 STRL (CAST SUPPLIES) ×3
PADDING CAST COTTON 6X4 STRL (CAST SUPPLIES) IMPLANT
SET HNDPC FAN SPRY TIP SCT (DISPOSABLE) IMPLANT
SOAP 2 % CHG 4 OZ (WOUND CARE) ×1 IMPLANT
SPONGE LAP 18X18 X RAY DECT (DISPOSABLE) ×9 IMPLANT
STAPLER VISISTAT 35W (STAPLE) ×3 IMPLANT
SUT ETHILON 5 0 PS 2 18 (SUTURE) ×2 IMPLANT
SUT MNCRL AB 4-0 PS2 18 (SUTURE) ×2 IMPLANT
TOWEL OR 17X24 6PK STRL BLUE (TOWEL DISPOSABLE) ×3 IMPLANT
TOWEL OR 17X26 10 PK STRL BLUE (TOWEL DISPOSABLE) ×3 IMPLANT
TUBE CONNECTING 12'X1/4 (SUCTIONS) ×1
TUBE CONNECTING 12X1/4 (SUCTIONS) ×1 IMPLANT
UNDERPAD 30X30 (UNDERPADS AND DIAPERS) ×3 IMPLANT
WATER STERILE IRR 1000ML POUR (IV SOLUTION) ×3 IMPLANT
YANKAUER SUCT BULB TIP NO VENT (SUCTIONS) ×2 IMPLANT

## 2018-01-13 NOTE — Anesthesia Procedure Notes (Signed)
Procedure Name: LMA Insertion Date/Time: 01/13/2018 1:34 PM Performed by: Adria Dill, CRNA Pre-anesthesia Checklist: Patient identified, Emergency Drugs available, Suction available and Patient being monitored Patient Re-evaluated:Patient Re-evaluated prior to induction Oxygen Delivery Method: Circle system utilized Preoxygenation: Pre-oxygenation with 100% oxygen Induction Type: IV induction LMA: LMA inserted LMA Size: 4.0 Number of attempts: 1 Placement Confirmation: positive ETCO2,  CO2 detector and breath sounds checked- equal and bilateral Tube secured with: Tape Dental Injury: Teeth and Oropharynx as per pre-operative assessment

## 2018-01-13 NOTE — Discharge Instructions (Signed)
Leave finger dressing intact - keep clean and dry until seen by MD, may remove arm dressing in 48 hours, clean with soap and water, apply small amount of antibiotic ointment and keep covered.

## 2018-01-13 NOTE — Transfer of Care (Signed)
Immediate Anesthesia Transfer of Care Note  Patient: Shaun Sanchez  Procedure(s) Performed: SKIN GRAFT RIGHT SMALL FINGER (Right Finger)  Patient Location: PACU  Anesthesia Type:General  Level of Consciousness: awake, drowsy and patient cooperative  Airway & Oxygen Therapy: Patient Spontanous Breathing and Patient connected to face mask oxygen  Post-op Assessment: Report given to RN and Post -op Vital signs reviewed and stable  Post vital signs: Reviewed and stable  Last Vitals:  Vitals Value Taken Time  BP 135/94 01/13/2018  2:17 PM  Temp    Pulse 90 01/13/2018  2:18 PM  Resp 22 01/13/2018  2:18 PM  SpO2 100 % 01/13/2018  2:18 PM  Vitals shown include unvalidated device data.  Last Pain:  Vitals:   01/13/18 0839  TempSrc: Oral         Complications: No apparent anesthesia complications
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
## 2018-01-13 NOTE — Anesthesia Postprocedure Evaluation (Signed)
Anesthesia Post Note  Patient: Shaun Sanchez  Procedure(s) Performed: SKIN GRAFT RIGHT SMALL FINGER (Right Finger)     Patient location during evaluation: PACU Anesthesia Type: General Level of consciousness: awake and alert Pain management: pain level controlled Vital Signs Assessment: post-procedure vital signs reviewed and stable Respiratory status: spontaneous breathing, nonlabored ventilation, respiratory function stable and patient connected to nasal cannula oxygen Cardiovascular status: blood pressure returned to baseline and stable Postop Assessment: no apparent nausea or vomiting Anesthetic complications: no    Last Vitals:  Vitals:   01/13/18 1432 01/13/18 1447  BP: (!) 134/93 (!) 136/99  Pulse: 88 88  Resp: 18 15  Temp:    SpO2: 99% 95%    Last Pain:  Vitals:   01/13/18 1447  TempSrc:   PainSc: Asleep                 Shelton Silvas
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
## 2018-01-14 ENCOUNTER — Encounter (HOSPITAL_COMMUNITY): Payer: Self-pay | Admitting: General Surgery

## 2018-09-23 ENCOUNTER — Other Ambulatory Visit: Payer: Self-pay

## 2018-09-23 ENCOUNTER — Emergency Department
Admission: EM | Admit: 2018-09-23 | Discharge: 2018-09-23 | Disposition: A | Discharge status: Home / Self Care | Payer: 59 | Attending: Emergency Medicine | Admitting: Emergency Medicine

## 2018-09-23 ENCOUNTER — Encounter: Payer: Self-pay | Admitting: Emergency Medicine

## 2018-09-23 DIAGNOSIS — X509XXA Other and unspecified overexertion or strenuous movements or postures, initial encounter: Secondary | ICD-10-CM | POA: Diagnosis not present

## 2018-09-23 DIAGNOSIS — Y999 Unspecified external cause status: Secondary | ICD-10-CM | POA: Insufficient documentation

## 2018-09-23 DIAGNOSIS — Y92009 Unspecified place in unspecified non-institutional (private) residence as the place of occurrence of the external cause: Secondary | ICD-10-CM | POA: Diagnosis not present

## 2018-09-23 DIAGNOSIS — S3992XA Unspecified injury of lower back, initial encounter: Secondary | ICD-10-CM | POA: Diagnosis present

## 2018-09-23 DIAGNOSIS — Z79899 Other long term (current) drug therapy: Secondary | ICD-10-CM | POA: Insufficient documentation

## 2018-09-23 DIAGNOSIS — Y9389 Activity, other specified: Secondary | ICD-10-CM | POA: Diagnosis not present

## 2018-09-23 DIAGNOSIS — F17290 Nicotine dependence, other tobacco product, uncomplicated: Secondary | ICD-10-CM | POA: Insufficient documentation

## 2018-09-23 DIAGNOSIS — T148XXA Other injury of unspecified body region, initial encounter: Secondary | ICD-10-CM | POA: Insufficient documentation

## 2018-09-23 MED ORDER — LIDOCAINE 5 % EX PTCH
1.0000 | MEDICATED_PATCH | CUTANEOUS | Status: DC
  Administered 2018-09-23: 15:00:00 1 via TRANSDERMAL
  Filled 2018-09-23: qty 1

## 2018-09-23 MED ORDER — KETOROLAC TROMETHAMINE 10 MG PO TABS: 10.0000 mg | ORAL_TABLET | Freq: Four times a day (QID) | ORAL | 0 refills | Status: AC | PRN

## 2018-09-23 MED ORDER — KETOROLAC TROMETHAMINE 30 MG/ML IJ SOLN
30.0000 mg | Freq: Once | INTRAMUSCULAR | Status: AC
  Administered 2018-09-23: 15:00:00 30 mg via INTRAMUSCULAR
  Filled 2018-09-23: qty 1

## 2018-09-23 MED ORDER — CYCLOBENZAPRINE HCL 5 MG PO TABS: ORAL_TABLET | ORAL | 0 refills | Status: AC

## 2018-09-23 NOTE — ED Triage Notes (Signed)
Patient presents to the ED with lower back pain that began this am.  Patient states pain has decreased throughout the day.  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

## 2018-09-23 NOTE — Discharge Instructions (Addendum)
Please take Flexeril to help relax your muscles.  Please take Toradol for pain and inflammation.  Do not take any additional Motrin.  Please use heat today and tomorrow.  Please follow-up with primary care or orthopedics.

## 2018-09-23 NOTE — ED Provider Notes (Signed)
[facility] Emergency Department Provider Note  ____________________________________________  Time seen: Approximately 2:39 PM  I have reviewed the triage vital signs and the nursing notes.   HISTORY  Chief Complaint Back Pain    HPI Shaun Sanchez is a 33 y.o. male that presents to the emergency department for evaluation of low back pain this morning. He has been doing a lot of housework recently. He also does a lot of heavy lifting this morning. He woke up this morning with worsening pain. He describes pain as aching. Pain does not radiate. Pain is worse when he twists to the right. He took a hot bath, which helped. Back pain has improved after he started to move around. No bowel or bladder dysfunction or saddle anesthesia.  No IV drug use.  No numbness or tingling.    Past Medical History:  Diagnosis Date  . Finger injury   . Hypertension   . Obesity     Patient Active Problem List   Diagnosis Date Noted  . OBESITY, NOS 05/08/2006  . ASTHMA, UNSPECIFIED 05/08/2006    Past Surgical History:  Procedure Laterality Date  . FRACTURE SURGERY     right hip  . SKIN FULL THICKNESS GRAFT Right 01/13/2018   Procedure: SKIN GRAFT RIGHT SMALL FINGER;  Surgeon: Dayna Barker, MD;  Location: [facility];  Service: Plastics;  Laterality: Right;    Prior to Admission medications   Medication Sig Start Date End Date Taking? Authorizing Provider  acetaminophen (TYLENOL) 500 MG tablet Take 1,000 mg by mouth every 6 (six) hours as needed for mild pain or headache.    [provider]  amoxicillin-clavulanate (AUGMENTIN) 875-125 MG tablet Take 1 tablet by mouth 2 (two) times daily. 01/12/18   Deno Etienne, DO  cyclobenzaprine (FLEXERIL) 5 MG tablet Take 1-2 tablets 3 times daily as needed 09/23/18   Laban Emperor, PA-C  [facility]rom[facility]an[facility] DM) 30-600 MG 12hr tablet Take 1 tablet by mouth as needed for cough.    [provider]  ketorolac  (TORADOL) 10 MG tablet Take 1 tablet (10 mg total) by mouth every 6 (six) hours as needed. 09/23/18   Laban Emperor, PA-C  oxyCODONE-acetaminophen (PERCOCET/ROXICET) 5-325 MG tablet Take 2 tablets by mouth every 4 (four) hours as needed for severe pain.    [provider]    Allergies Patient has no known allergies.  Family History  Problem Relation Age of Onset  . Diabetes Mother     Social History Social History   Tobacco Use  . Smoking status: Current Every Day Smoker    Packs/day: 1.50    Types: Cigarettes  . Smokeless tobacco: Current User    Types: Chew, Snuff  Substance Use Topics  . Alcohol use: No  . Drug use: No     Review of Systems  Constitutional: No fever/chills Cardiovascular: No chest pain. Respiratory: No SOB. Gastrointestinal: No abdominal pain.  No nausea, no vomiting.  Musculoskeletal: Positive for low back pain.  Skin: Negative for rash, abrasions, lacerations, ecchymosis. Neurological: Negative for numbness or tingling   ____________________________________________   PHYSICAL EXAM:  VITAL SIGNS: ED Triage Vitals  Enc Vitals Group     BP 09/23/18 1346 (!) 130/104     Pulse Rate 09/23/18 1346 100     Resp 09/23/18 1346 16     Temp 09/23/18 1346 98.5 F (36.9 C)     Temp Source 09/23/18 1346 Oral     SpO2 09/23/18 1346 98 %  Weight 09/23/18 1347 235 lb (106.6 kg)     Height 09/23/18 1347 6\' 2"  (1.88 m)     Head Circumference --      Peak Flow --      Pain Score 09/23/18 1346 6     Pain Loc --      Pain Edu? --      Excl. in GC? --      Constitutional: Alert and oriented. Well appearing and in no acute distress. Eyes: Conjunctivae are normal. PERRL. EOMI. Head: Atraumatic. ENT:      Ears:      Nose: No congestion/rhinnorhea.      Mouth/Throat: Mucous membranes are moist.  Neck: No stridor. Cardiovascular: Normal rate, regular rhythm.  Good peripheral circulation. Respiratory: Normal respiratory effort without  tachypnea or retractions. Lungs CTAB. Good air entry to the bases with no decreased or absent breath sounds. Gastrointestinal: Bowel sounds 4 quadrants. Soft and nontender to palpation. No guarding or rigidity. No palpable masses. No distention.  Musculoskeletal: Full range of motion to all extremities. No gross deformities appreciated. Mild tenderness to palpation diffusely to lumbar spine and lumbar paraspinal muscles. Strength equal in lower extremities bilaterally. Normal gait.  Neurologic:  Normal speech and language. No gross focal neurologic deficits are appreciated.  Skin:  Skin is warm, dry and intact. No rash noted. Psychiatric: Mood and affect are normal. Speech and behavior are normal. Patient exhibits appropriate insight and judgement.   ____________________________________________   LABS (all labs ordered are listed, but only abnormal results are displayed)  Labs Reviewed - No data to display ____________________________________________  EKG   ____________________________________________  RADIOLOGY   No results found.  ____________________________________________    PROCEDURES  Procedure(s) performed:    Procedures    Medications  lidocaine (LIDODERM) 5 % 1 patch (1 patch Transdermal Patch Applied 09/23/18 1527)  ketorolac (TORADOL) 30 MG/ML injection 30 mg (30 mg Intramuscular Given 09/23/18 1528)     ____________________________________________   INITIAL IMPRESSION / ASSESSMENT AND PLAN / ED COURSE  Pertinent labs & imaging results that were available during my care of the patient were reviewed by me and considered in my medical decision making (see chart for details).  Review of the [place] CSRS was performed in accordance of the [facility] prior to dispensing any controlled drugs.   Patient's diagnosis is consistent with muscle strain. Vital signs and exam are reassuring.  Exam is overall unremarkable.  Patient will be discharged home with prescriptions  for toradol and flexeril. Patient is to follow up with PCP and ortho as directed. Patient is given ED precautions to return to the ED for any worsening or new symptoms.  Shaun Sanchez was evaluated in Emergency Department on 09/23/2018 for the symptoms described in the history of present illness. He was evaluated in the context of the global COVID-19 pandemic, which necessitated consideration that the patient might be at risk for infection with the SARS-CoV-2 virus that causes COVID-19. Institutional protocols and algorithms that pertain to the evaluation of patients at risk for COVID-19 are in a state of rapid change based on information released by regulatory bodies including the CDC and federal and state organizations. These policies and algorithms were followed during the patient's care in the ED.   ____________________________________________  FINAL CLINICAL IMPRESSION(S) / ED DIAGNOSES  Final diagnoses:  Muscle strain      NEW MEDICATIONS STARTED DURING THIS VISIT:  ED Discharge Orders         Ordered  ketorolac (TORADOL) 10 MG tablet  Every 6 hours PRN     09/23/18 1537    cyclobenzaprine (FLEXERIL) 5 MG tablet     09/23/18 1537              This chart was dictated using voice recognition software/Dragon. Despite best efforts to proofread, errors can occur which can change the meaning. Any change was purely unintentional.    Enid DerryWagner, Caitrin Pendergraph, PA-C 09/23/18 1756    Concha SeFunke, Mary E, MD 09/24/18 1229
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
